# Patient Record
Sex: Female | Born: 1990 | Race: White | Hispanic: No | Marital: Married | Smoking: Never smoker
Health system: Southern US, Community
[De-identification: ages and names within clinical notes are randomized; demographics above are authoritative.]

## PROBLEM LIST (undated history)

## (undated) DIAGNOSIS — Z789 Other specified health status: Secondary | ICD-10-CM

## (undated) HISTORY — PX: APPENDECTOMY: SHX54

---

## 2015-10-30 ENCOUNTER — Other Ambulatory Visit (HOSPITAL_COMMUNITY): Payer: Self-pay | Admitting: Chiropractic Medicine

## 2015-10-30 ENCOUNTER — Ambulatory Visit (HOSPITAL_COMMUNITY)
Admission: RE | Admit: 2015-10-30 | Discharge: 2015-10-30 | Disposition: A | Discharge status: Home / Self Care | Payer: 59 | Source: Ambulatory Visit | Attending: Chiropractic Medicine | Admitting: Chiropractic Medicine

## 2015-10-30 DIAGNOSIS — R52 Pain, unspecified: Secondary | ICD-10-CM

## 2015-10-30 DIAGNOSIS — M542 Cervicalgia: Secondary | ICD-10-CM | POA: Insufficient documentation

## 2016-01-15 IMAGING — CR DG CERVICAL SPINE COMPLETE 4+V
5 series · 5 of 5 positions shown · non-contrast
Comparison: None.

CLINICAL DATA: Neck pain

EXAM:
CERVICAL SPINE - COMPLETE 4+ VIEW

[c-spine lat]
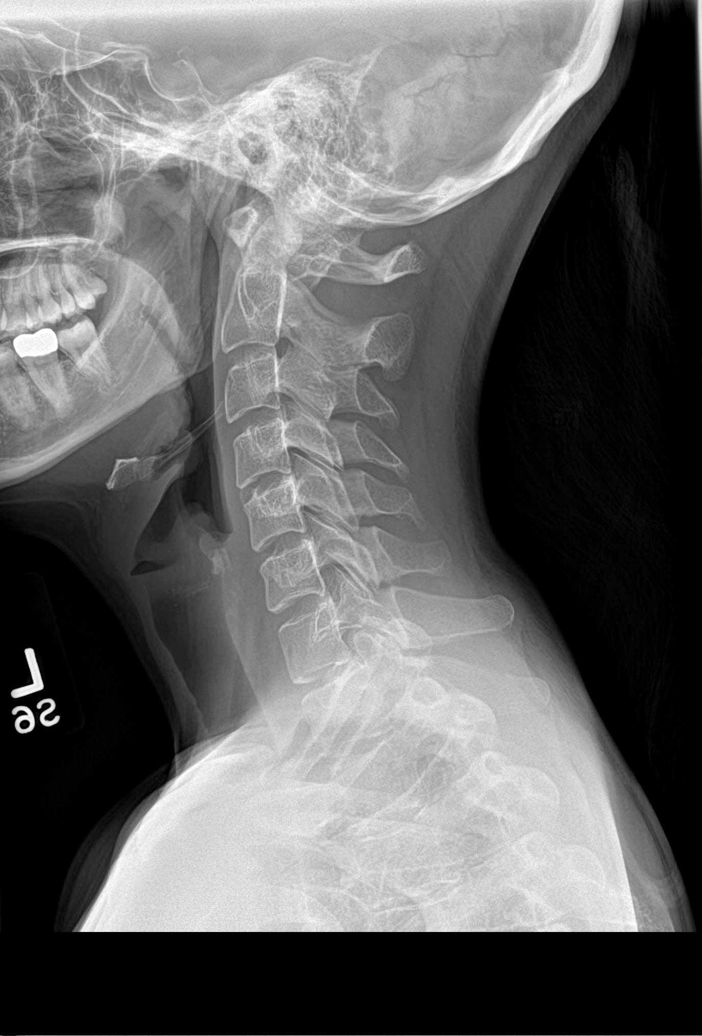

[c-spine obl (1 of 2)]
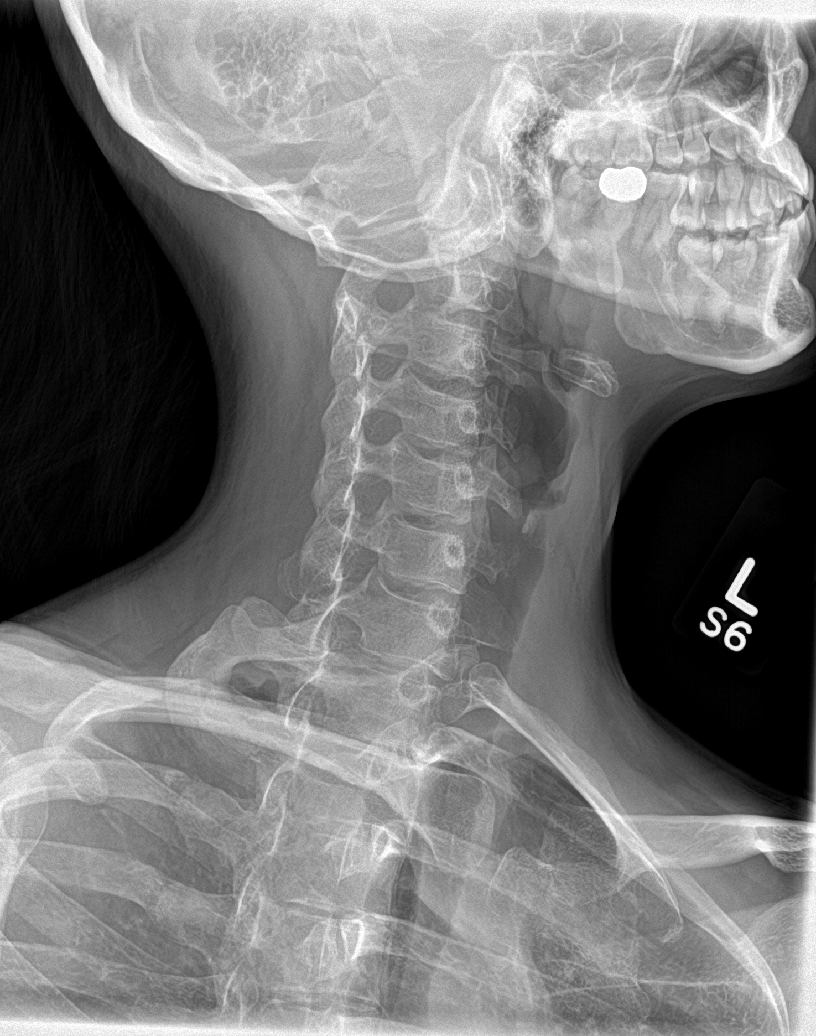

[c-spine obl (2 of 2)]
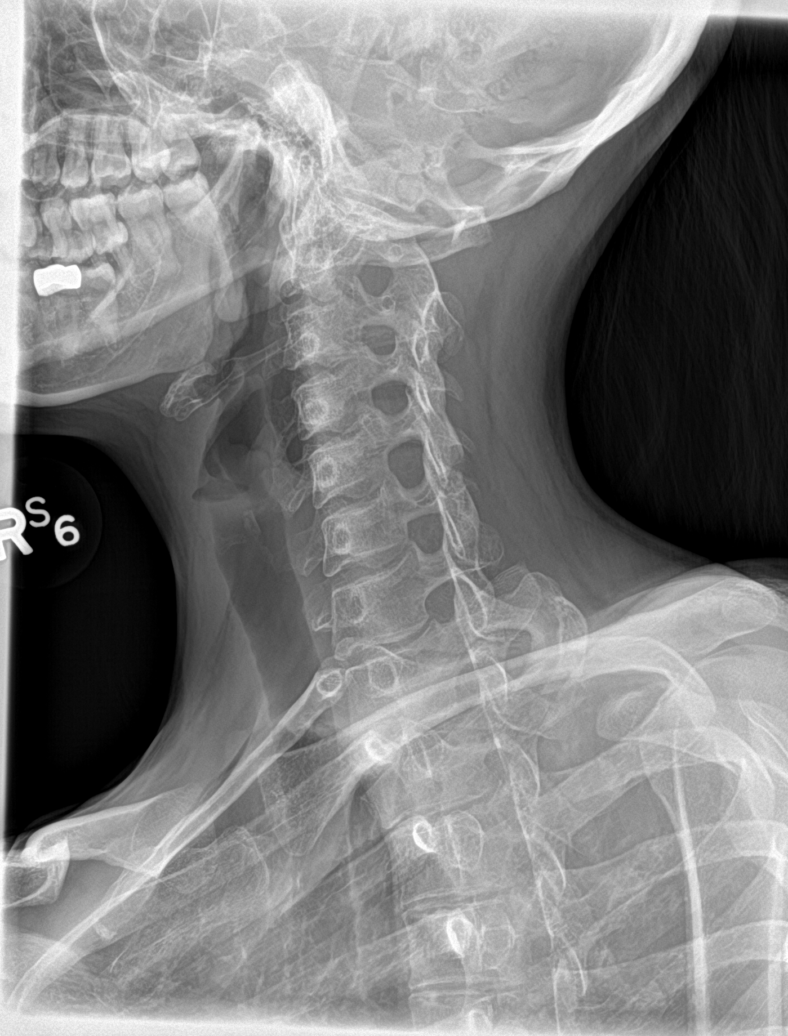

[c-spine ap]
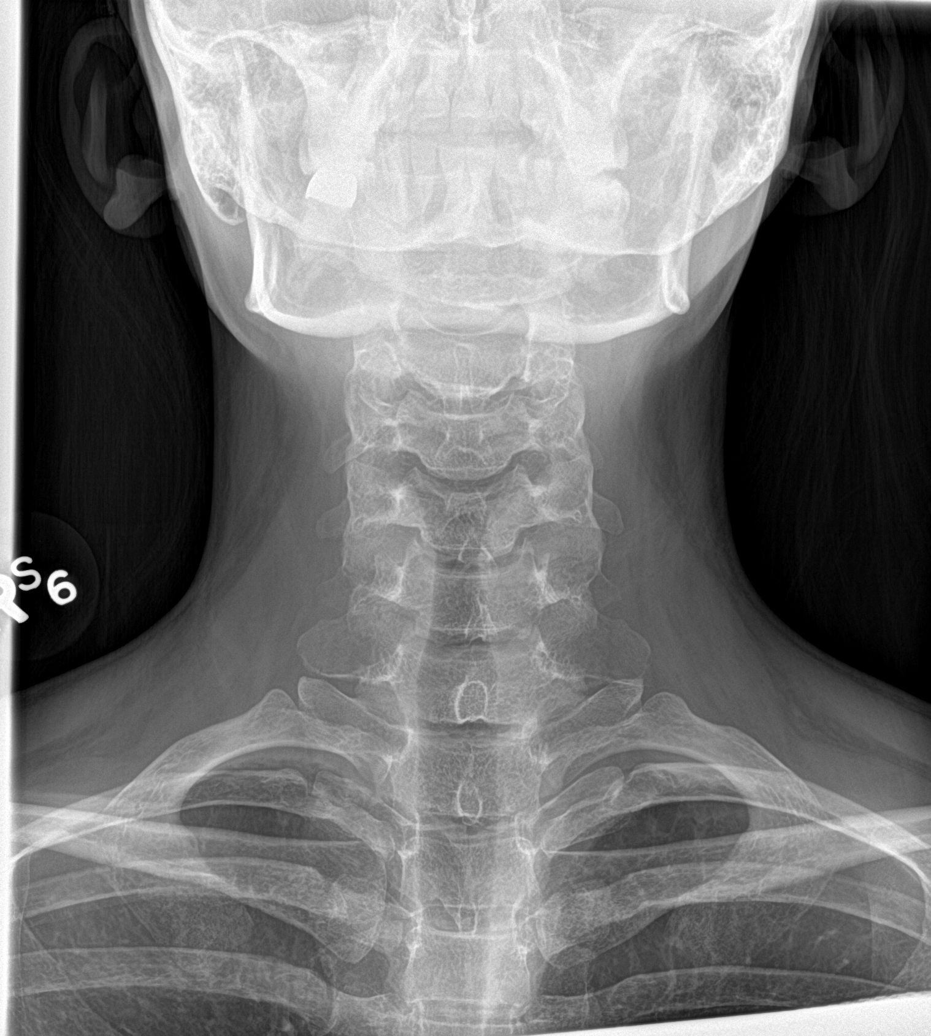

[c-spine open mouth]
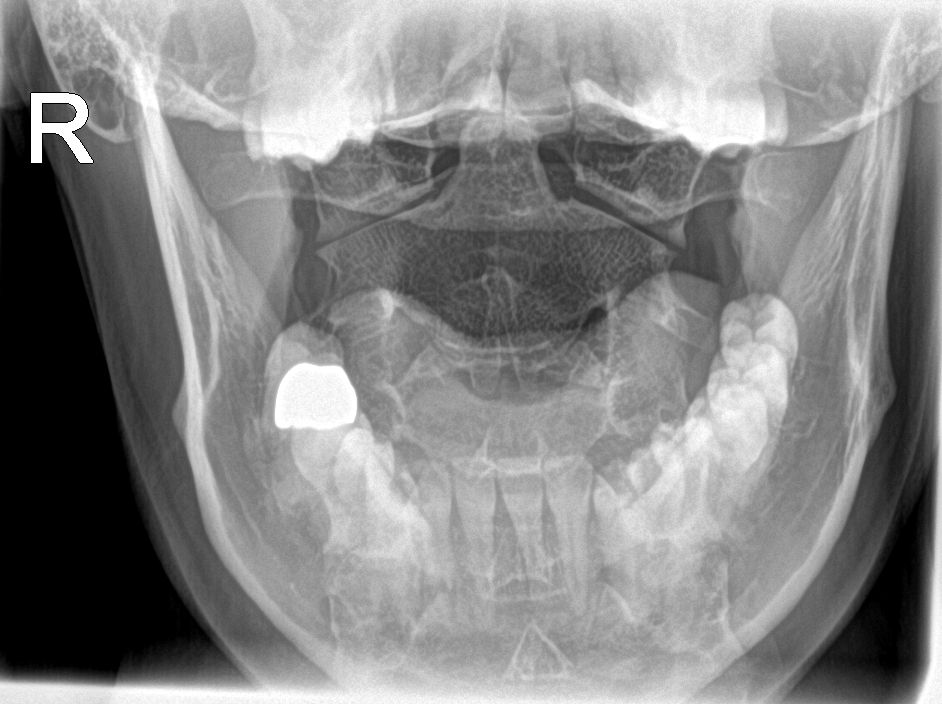

[5 of 5 positions shown; findings below may reference images not displayed]

FINDINGS: Five views of the cervical spine submitted. No acute fracture or
subluxation. Alignment, disc spaces and vertebral body heights are
preserved. No neural foramina narrowing noted on oblique views. No
prevertebral soft tissue swelling. Cervical airway is patent.
IMPRESSION: Negative cervical spine radiographs.

## 2016-07-14 ENCOUNTER — Other Ambulatory Visit (HOSPITAL_COMMUNITY): Payer: Self-pay | Admitting: Obstetrics & Gynecology

## 2016-07-14 DIAGNOSIS — Z3689 Encounter for other specified antenatal screening: Secondary | ICD-10-CM

## 2016-07-14 DIAGNOSIS — Z3A30 30 weeks gestation of pregnancy: Secondary | ICD-10-CM

## 2016-07-15 ENCOUNTER — Ambulatory Visit (HOSPITAL_COMMUNITY)
Admission: RE | Admit: 2016-07-15 | Discharge: 2016-07-15 | Disposition: A | Discharge status: Home / Self Care | Payer: 59 | Source: Ambulatory Visit | Attending: Obstetrics & Gynecology | Admitting: Obstetrics & Gynecology

## 2016-07-15 ENCOUNTER — Encounter (HOSPITAL_COMMUNITY): Payer: Self-pay | Admitting: *Deleted

## 2016-07-15 ENCOUNTER — Other Ambulatory Visit (HOSPITAL_COMMUNITY): Payer: Self-pay | Admitting: Obstetrics & Gynecology

## 2016-07-15 DIAGNOSIS — O365930 Maternal care for other known or suspected poor fetal growth, third trimester, not applicable or unspecified: Secondary | ICD-10-CM | POA: Insufficient documentation

## 2016-07-15 DIAGNOSIS — Z36 Encounter for antenatal screening of mother: Secondary | ICD-10-CM | POA: Insufficient documentation

## 2016-07-15 DIAGNOSIS — Z3A30 30 weeks gestation of pregnancy: Secondary | ICD-10-CM

## 2016-07-15 DIAGNOSIS — Z3689 Encounter for other specified antenatal screening: Secondary | ICD-10-CM

## 2016-07-15 HISTORY — DX: Other specified health status: Z78.9

## 2016-07-17 ENCOUNTER — Encounter (HOSPITAL_COMMUNITY): Payer: Self-pay

## 2016-08-05 ENCOUNTER — Encounter (HOSPITAL_COMMUNITY): Payer: Self-pay

## 2016-08-05 ENCOUNTER — Ambulatory Visit (HOSPITAL_COMMUNITY)
Admission: RE | Admit: 2016-08-05 | Discharge: 2016-08-05 | Disposition: A | Discharge status: Home / Self Care | Payer: 59 | Source: Ambulatory Visit | Attending: Obstetrics & Gynecology | Admitting: Obstetrics & Gynecology

## 2016-08-05 ENCOUNTER — Ambulatory Visit (HOSPITAL_COMMUNITY): Payer: 59

## 2016-08-05 DIAGNOSIS — O35HXX0 Maternal care for other (suspected) fetal abnormality and damage, fetal lower extremities anomalies, not applicable or unspecified: Secondary | ICD-10-CM | POA: Insufficient documentation

## 2016-08-05 DIAGNOSIS — Z3A33 33 weeks gestation of pregnancy: Secondary | ICD-10-CM | POA: Diagnosis not present

## 2016-08-05 DIAGNOSIS — O358XX0 Maternal care for other (suspected) fetal abnormality and damage, not applicable or unspecified: Secondary | ICD-10-CM | POA: Insufficient documentation

## 2016-08-05 DIAGNOSIS — Z315 Encounter for genetic counseling: Secondary | ICD-10-CM | POA: Insufficient documentation

## 2016-08-05 DIAGNOSIS — O365930 Maternal care for other known or suspected poor fetal growth, third trimester, not applicable or unspecified: Secondary | ICD-10-CM

## 2016-08-05 DIAGNOSIS — O283 Abnormal ultrasonic finding on antenatal screening of mother: Secondary | ICD-10-CM | POA: Insufficient documentation

## 2016-08-05 NOTE — Progress Notes (Signed)
Genetic Counseling  High-Risk Gestation Note  Appointment Date:  08/05/2016 Referred By: Waymon Amato, MD Date of Birth:  [birth year] Partner:  McMullen Lions   Pregnancy History: G1P0 Estimated Date of Delivery: 09/17/16 Estimated Gestational Age: 15w6dAttending: MRenella Cunas MD   I met with Mrs. KCorena Tilsonand her husband, Mr. NSiearra Amberg for genetic counseling because of abnormal ultrasound findings.   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 inheritance but reviewed increased risk for underlying genetic or chromosome etiology  Presence of additional findings, such as cleft palate, would be associated with increased risk for underlying single etiology  Reviewed options for additional screening  NIPS-declined  Echocardiogram-scheduled 8/24  Ongoing ultrasound-patient plans to follow-up with OB office  Reviewed options for diagnostic testing, including risks, benefits, limitations and alternatives  Amniocentesis- declined  Reviewed family history concerns  [id number] maternal first cousins once removed to patient with hemophilia  Reviewed X-linked inheritance of hemophilia  Based on report, patient's risk to be carrie would be 1 in 827(may theoretically be lower given that the patient has two unaffected maternal uncles)  We began by reviewing the ultrasound in detail. [facility]ound [facility]16 visualized left fetal club foot, possible cleft palate without cleft lip, and overall fetal weight at 18%tile with AC <3%tile. Ultrasound performed today again visualized left fetal club foot. Cleft palate was not able to be ruled in or ruled out on today's ultrasound.  Complete ultrasound reports under separate cover.  Clubfoot/feet is a term  that actually describes three distinct anomalies (talipes equinovarus, talipes calcaneovalgus, and metatarsus varus) and occurs in 1 in 1000 births. The most common type of clubfeet, talipes equinovarus, is characterized by forefoot adduction with supination, heel varus, and ankle equines, which cannot be brought back to a neutral position. They were counseled that clubfeet can be an isolated difference, occur as a feature of an underlying syndrome, or the result of neurological impairment. We discussed that the most likely mode of inheritance for nonsyndromic, isolated clubfoot/feet is multifactorial. There is a known genetic component for multifactorial clubfoot, as demonstrated by twin studies; environmental factors also play a role, including infection, drugs, and intrauterine environment (oligohydramnios, fetal positioning). While the majority of cases of clubfoot/feet are isolated, it is a feature in more than 200 known genetic syndromes, including both chromosomal and single gene conditions. We reviewed chromosomes, nondisjunction and the features of Down syndrome, trisomy 138 and 122 We discussed that the risk for other chromosome aberrations is slightly increased (microdeletions, microduplications, insertions, translocations). We reviewed single gene conditions including common inheritance patterns and associated risks for recurrence. If cleft palate is also present, the risk for an underlying chromosome or single gene condition would be increased.   This couple was counseled regarding the option of noninvasive prenatal screening (NIPS)/cell free DNA (cfDNA) testing. We reviewed that although highly specific and sensitive, this testing is not considered to be diagnostic and does not detect all chromosome aberrations. We reviewed the detection and false positive rates of NIPS as well as the associated cost. We then discussed the option of amniocentesis, including the limitations, benefits, and risks. They  understand that amniocentesis allows evaluation of the fetal chromosomes, but cannot detect all genetic conditions. Specifically, we discussed that single gene conditions are difficult to diagnose prenatally unless a  specific condition is suspected based on additional ultrasound findings or family history. Additionally, we discussed the availability of microarray analysis, which can be performed pre and postnatally. They were counseled that microarray analysis is a molecular based technique in which a test sample of DNA (fetal) is compared to a reference (normal) genome in order to determine if the test sample has any extra or missing genetic information. Microarray analysis allows for the detection of genetic deletions and duplications that are 920 times smaller than those identified by routine chromosome analysis. We discussed that recent publications show that approximately 6% of patients with an abnormal fetal ultrasound and a normal fetal karyotype had a significant microdeletion/microduplication detected by prenatal microarray analysis. After thoughtful consideration, this couple declined NIPS and amniocentesis. They understand that ultrasound cannot diagnose or rule out all birth defects or genetic syndromes.   We discussed that postnatal care for clubfoot is typically managed by a pediatric orthopedic specialist. The couple stated that they may plan to move back to the patient's home state prior to delivery and would likely be pursuing postnatal care there.   Both family histories were reviewed and found to be contributory for a congenital heart defect for Mr. Usery nephew (his identical twin brother's son, who is also a twin). The hole in the heart was reportedly either a VSD or ASD and resolved without medical intervention. He is currently 25 year old and reportedly healthy. Congenital heart defects occur in approximately 1% of pregnancies.  Congenital heart defects may occur due to multifactorial  influences, chromosomal abnormalities, genetic syndromes or environmental exposures.  Isolated heart defects are generally multifactorial.  Given the reported family history and assuming multifactorial inheritance, the risk for a congenital heart defect in the current pregnancy does not appear to be increased above the general population risk.  Mr. Strieter also reported a female maternal first cousin with significant mental and physical disability. He reportedly was unable to walk or talk and died in his 96's. The couple had limited information regarding this relative's condition, but stated that he possibly had spina bifida. We briefly reviewed that there can be many causes for mental and physical disability including environment, genes, and multifactorial. Given the degree of relation and reportedly family history, recurrence risk for their children is likely low. However, additional information is needed regarding the specific condition and the etiology in order to accurately determine recurrence risk and available screening/testing for relatives.   Mrs. Leap reported two female maternal first cousins once removed (her maternal grandmother's sister's sons) with hemophilia. She had limited information regarding the type of hemophilia. Hemophilia is a genetic condition characterized by either a deficiency of the Factor VIII protein (Hemophilia A) or deficiency of Factor IX (Hemophilia B).  Factor VIII and Factor IX proteins allow the blood to clot normally.  If one of these proteins does not work as it should (caused by changes in the Factor VIII or Factor IX genes on the X chromosome), this will result in excessive and prolonged bleeding and easy bruising.  Hemophilia can be treated through injections of clotting factors as needed or to prevent bleeds from occurring, but there is no cure at this time. We reviewed that both types of hemophilia follow X-linked recessive inheritance. A female "carrier" refers to a  woman with one working copy and one nonworking copy of their gene for hemophilia.  Carriers may have no symptoms or mild symptoms of the condition.  Each time a carrier female is pregnant, the pregnancy  has a new chance for each of the following outcomes: (1) a normal female, (2) a carrier female like the mother with no symptoms to mild symptoms, (3) an unaffected female (not a carrier), or (4) a female with hemophilia. Males with hemophilia would be expected to have carrier daughters and unaffected sons. We reviewed that given the reported family history, Ms. Koralynn Greenspan has a 1 in 8 chance to be a carrier for hemophilia; however, this risk may theoretically be lower given the report of two unaffected maternal uncles to the patient. Mrs. Diosdado indicated that she is not concerned regarding this history at this time. We discussed that it may be helpful for pediatricians to be aware of this history so that their child(ren) can be screened appropriately, if warranted. Additional information regarding this family history may alter recurrence risk assessment.  Without further information regarding the provided family history, an accurate genetic risk cannot be calculated. Further genetic counseling is warranted if more information is obtained.  Mrs. Tatem Holsonback was provided with written information regarding cystic fibrosis (CF), spinal muscular atrophy (SMA) and hemoglobinopathies including the carrier frequency, availability of carrier screening and prenatal diagnosis if indicated.  In addition, we discussed that CF and hemoglobinopathies are routinely screened for as part of the [place] newborn screening panel.  After further discussion, she declined screening for CF, SMA and hemoglobinopathies..   Mrs. Shavaughn Seidl denied exposure to environmental toxins or chemical agents. She denied the use of alcohol, tobacco or street drugs. She denied significant viral illnesses during the course of her pregnancy. Her medical  and surgical histories were noncontributory.   I counseled this couple regarding the above risks and available options.  The approximate face-to-face time with the genetic counselor was 30 minutes.  Chipper Oman, MS Certified Genetic Counselor 08/05/2016

## 2016-08-15 ENCOUNTER — Encounter (HOSPITAL_COMMUNITY): Payer: Self-pay

## 2016-10-21 IMAGING — US US MFM OB FOLLOW-UP
1 series · 14 of 28 positions shown · non-contrast
Comparison: none

[Series 1: us mfm ob follow-up · 71 acquisitions, 14 frames shown]
[im 3/71]
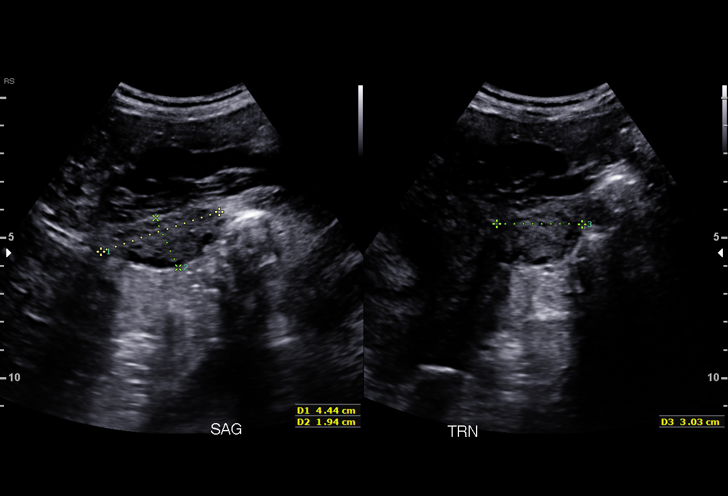
[im 8/71]
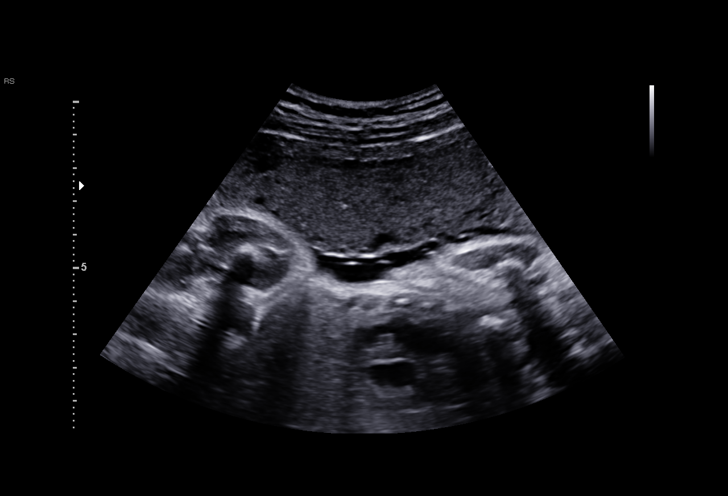
[im 13/71]
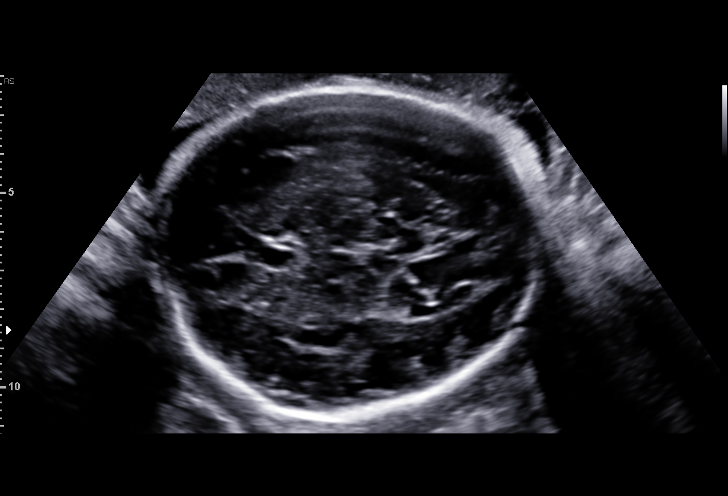
[im 19/71]
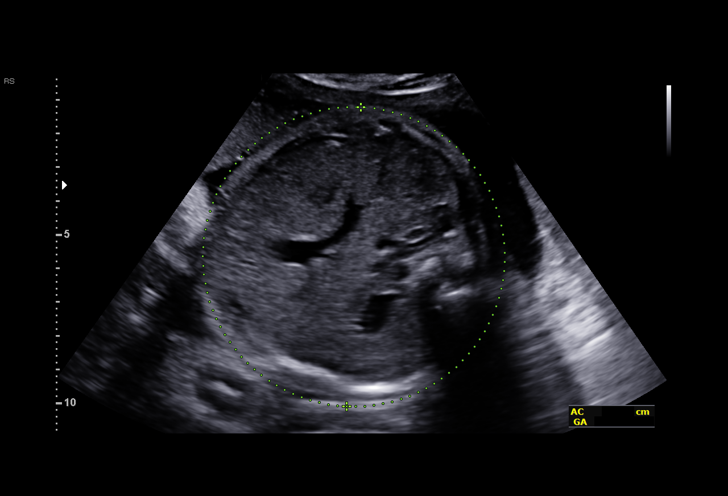
[im 24/71]
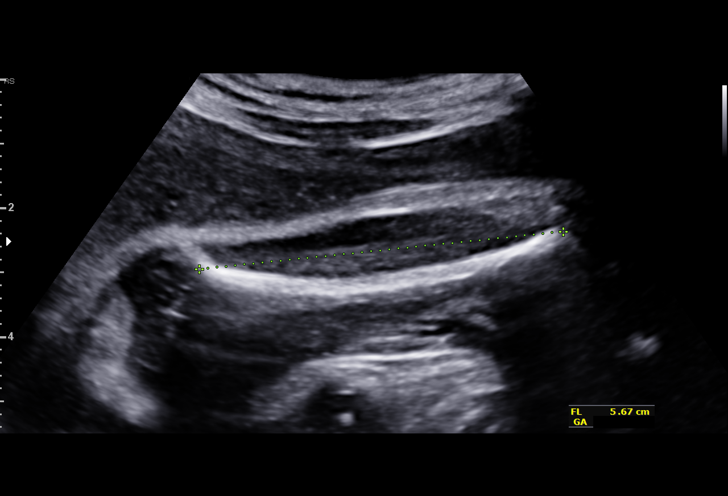
[im 29/71]
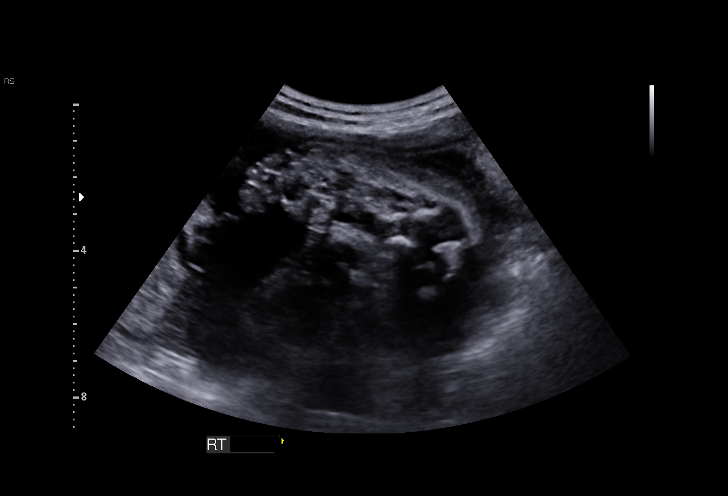
[im 34/71]
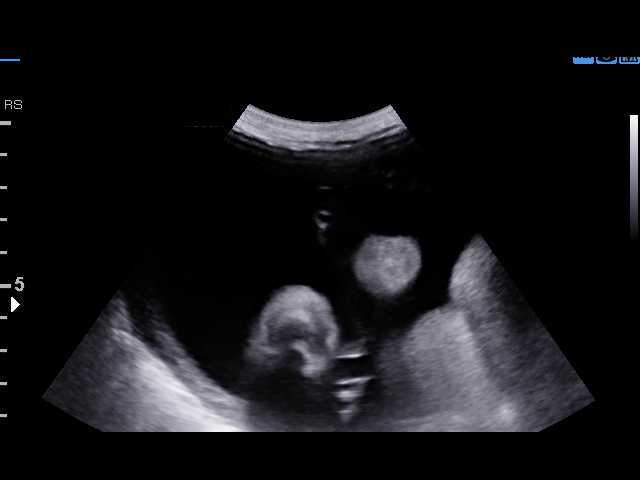
[im 39/71]
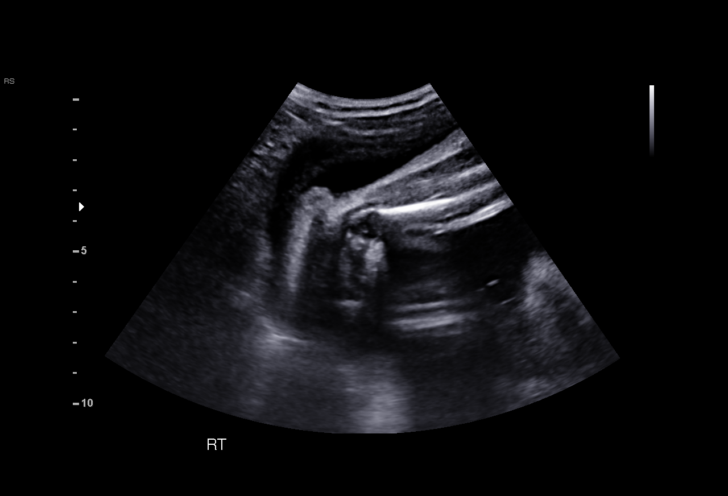
[im 45/71]
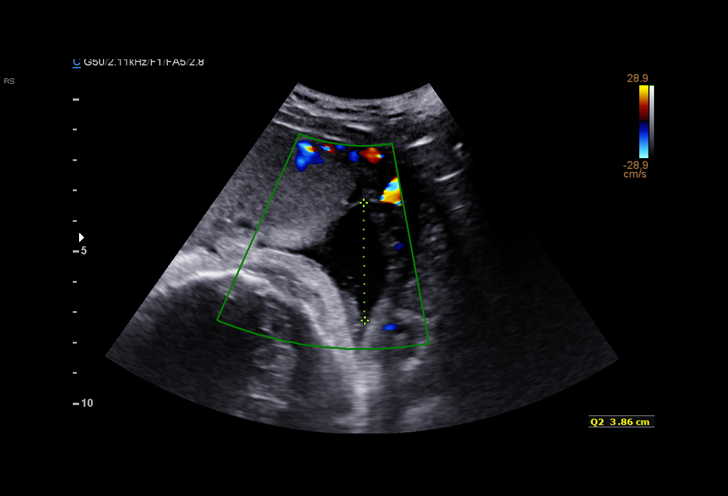
[im 50/71]
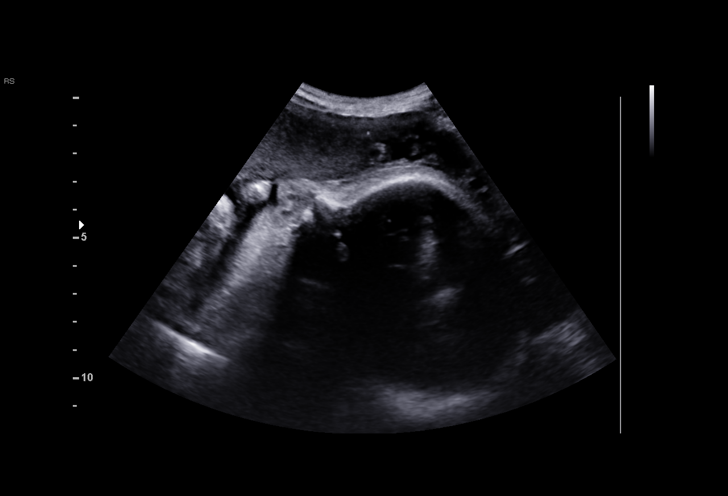
[im 55/71]
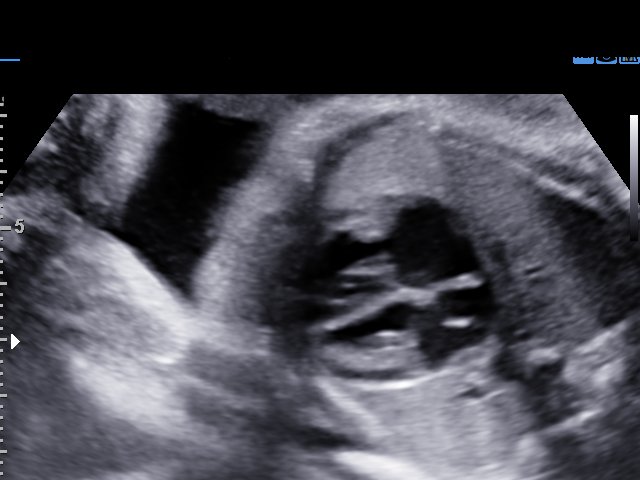
[im 60/71]
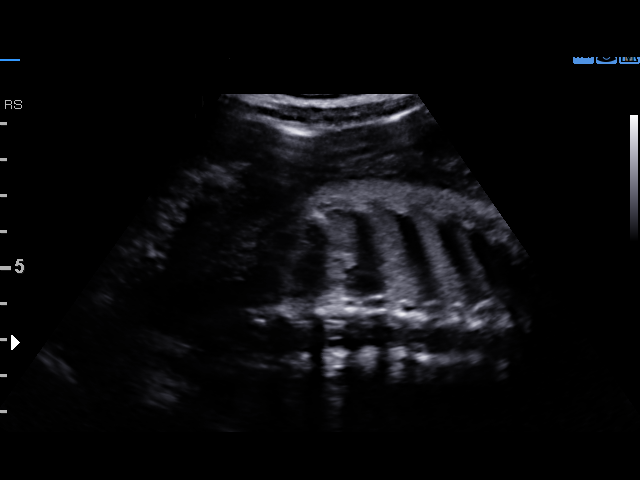
[im 65/71]
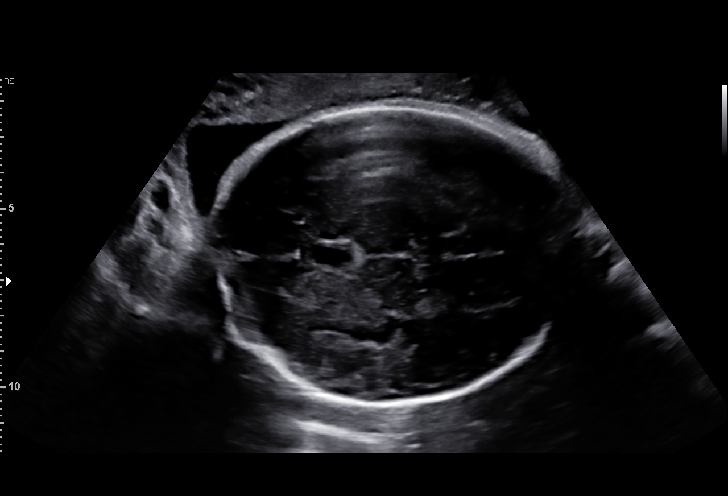
[im 71/71]
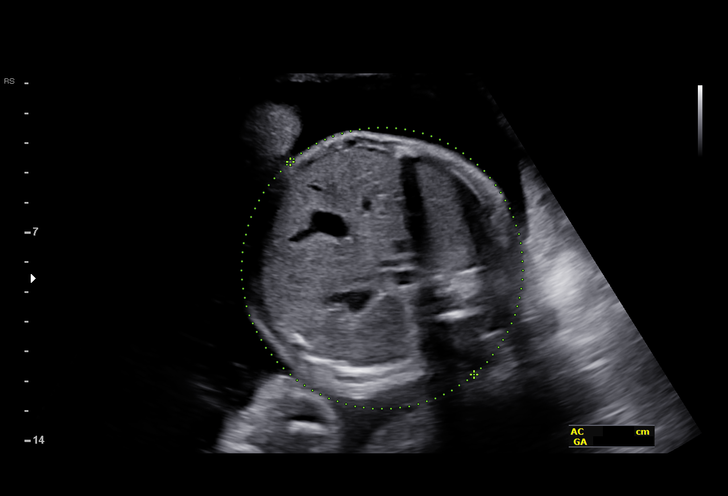

[14 of 28 positions shown; findings below may reference images not displayed]

OB

1  PAULUS N CEEJAY            [id number]      [id number]     [id number]
2  PAULUS N CEEJAY            [id number]      [id number]     [id number]
Indications

33 weeks gestation of pregnancy
Maternal care for known or suspected poor
fetal growth, third trimester, not applicable or
unspecified
Fetal abnormality - other known or
suspected ( Lt club foot, ? cleft palate, ? abn
Rt foot)
OB History

Gravidity:    1
Fetal Evaluation

Num Of Fetuses:     1
Fetal Heart         129
Rate(bpm):
Cardiac Activity:   Observed
Presentation:       Cephalic
Placenta:           Anterior, above cervical os
P. Cord Insertion:  Previously Visualized

Amniotic Fluid
AFI FV:      Subjectively within normal limits

Largest Pocket(cm)
12.58
Biometry

BPD:      80.5  mm     G. Age:  32w 2d         10  %    CI:        69.32   %   70 - 86
FL/HC:      18.5   %   19.4 -
HC:      308.7  mm     G. Age:  34w 3d         29  %    HC/AC:      1.07       0.96 -
AC:      288.4  mm     G. Age:  32w 6d         25  %    FL/BPD:     70.8   %   71 - 87
FL:         57  mm     G. Age:  29w 6d        < 3  %    FL/AC:      19.8   %   20 - 24
HUM:      55.4  mm     G. Age:  32w 2d         24  %

Est. FW:    0344  gm      4 lb 3 oz     26  %
Gestational Age

LMP:           33w 6d       Date:   12/12/15                 EDD:   09/17/16
U/S Today:     32w 3d                                        EDD:   09/27/16
Best:          33w 6d    Det. By:   LMP  (12/12/15)          EDD:   09/17/16
Anatomy

Cranium:               Appears normal         Aortic Arch:            Previously seen
Cavum:                 Previously seen        Ductal Arch:            Previously seen
Ventricles:            Appears normal         Diaphragm:              Appears normal
Choroid Plexus:        Previously seen        Stomach:                Appears normal, left
sided
Cerebellum:            Previously seen        Abdomen:                Previously seen
Posterior Fossa:       Previously seen        Abdominal Wall:         Previously seen
Nuchal Fold:           Not applicable (>20    Cord Vessels:           Previously seen
wks GA)
Face:                  Orbits and profile     Kidneys:                Appear normal
previously seen
Lips:                  Appears normal         Bladder:                Appears normal
Palate:                Not well visualized    Spine:                  Previously seen
Heart:                 Appears normal         Upper Extremities:      Previously seen
(4CH, axis, and
situs)
RVOT:                  Previously seen        Lower Extremities:      Abnormal, see
comments
LVOT:                  Appears normal

Other:  Fetus appears to be a male. 5th previously digit visualized.
Technically difficult due to advanced GA and fetal position.
Doppler - Fetal Vessels

Umbilical Artery
S/D     %tile                                            ADFV    RDFV
2.52       49                                                No      No

Cervix Uterus Adnexa

Cervix
Not visualized (advanced GA >36wks)

Uterus
No abnormality visualized.

Left Ovary
Not visualized.
Right Ovary
Not visualized.

Adnexa:       No abnormality visualized. No adnexal mass
visualized.
Impression

SIUP at 33+6 weeks
Left club foot
All other interval fetal anatomy was seen and appeared
normal; anatomic survey complete
Normal amniotic fluid volume
Appropriate interval growth with EFW at the 26th %tile; AC at
the 25th %tile
UA dopplers were normal for this GA
Recommendations

Follow-up ultrasound for growth in 3 weeks
Postnatal evaluation for possible cleft palate?

## 2017-05-20 ENCOUNTER — Encounter (HOSPITAL_COMMUNITY): Payer: Self-pay
